# Patient Record
Sex: Male | Born: 1935 | Race: White | Hispanic: No | Smoking: Never smoker
Health system: Southern US, Community
[De-identification: ages and names within clinical notes are randomized; demographics above are authoritative.]

## PROBLEM LIST (undated history)

## (undated) DIAGNOSIS — I10 Essential (primary) hypertension: Secondary | ICD-10-CM

## (undated) DIAGNOSIS — E119 Type 2 diabetes mellitus without complications: Secondary | ICD-10-CM

## (undated) DIAGNOSIS — E079 Disorder of thyroid, unspecified: Secondary | ICD-10-CM

## (undated) DIAGNOSIS — M1990 Unspecified osteoarthritis, unspecified site: Secondary | ICD-10-CM

## (undated) HISTORY — PX: HERNIA REPAIR: SHX51

---

## 2017-05-15 ENCOUNTER — Other Ambulatory Visit: Payer: Self-pay

## 2017-05-15 ENCOUNTER — Emergency Department (HOSPITAL_BASED_OUTPATIENT_CLINIC_OR_DEPARTMENT_OTHER): Payer: Medicare Other

## 2017-05-15 ENCOUNTER — Encounter (HOSPITAL_BASED_OUTPATIENT_CLINIC_OR_DEPARTMENT_OTHER): Payer: Self-pay | Admitting: Emergency Medicine

## 2017-05-15 ENCOUNTER — Emergency Department (HOSPITAL_BASED_OUTPATIENT_CLINIC_OR_DEPARTMENT_OTHER)
Admission: EM | Admit: 2017-05-15 | Discharge: 2017-05-15 | Disposition: A | Discharge status: Home / Self Care | Payer: Medicare Other | Attending: Emergency Medicine | Admitting: Emergency Medicine

## 2017-05-15 DIAGNOSIS — I10 Essential (primary) hypertension: Secondary | ICD-10-CM | POA: Insufficient documentation

## 2017-05-15 DIAGNOSIS — M25561 Pain in right knee: Secondary | ICD-10-CM | POA: Insufficient documentation

## 2017-05-15 DIAGNOSIS — Z79899 Other long term (current) drug therapy: Secondary | ICD-10-CM | POA: Insufficient documentation

## 2017-05-15 DIAGNOSIS — E119 Type 2 diabetes mellitus without complications: Secondary | ICD-10-CM | POA: Diagnosis not present

## 2017-05-15 DIAGNOSIS — G8929 Other chronic pain: Secondary | ICD-10-CM | POA: Diagnosis not present

## 2017-05-15 DIAGNOSIS — M899 Disorder of bone, unspecified: Secondary | ICD-10-CM

## 2017-05-15 DIAGNOSIS — M7989 Other specified soft tissue disorders: Secondary | ICD-10-CM

## 2017-05-15 DIAGNOSIS — R2241 Localized swelling, mass and lump, right lower limb: Secondary | ICD-10-CM | POA: Insufficient documentation

## 2017-05-15 DIAGNOSIS — R600 Localized edema: Secondary | ICD-10-CM | POA: Diagnosis not present

## 2017-05-15 HISTORY — DX: Disorder of thyroid, unspecified: E07.9

## 2017-05-15 HISTORY — DX: Type 2 diabetes mellitus without complications: E11.9

## 2017-05-15 HISTORY — DX: Essential (primary) hypertension: I10

## 2017-05-15 HISTORY — DX: Unspecified osteoarthritis, unspecified site: M19.90

## 2017-05-15 LAB — COMPREHENSIVE METABOLIC PANEL
ALT: 14 U/L — ABNORMAL LOW (ref 17–63)
ANION GAP: 9 (ref 5–15)
AST: 15 U/L (ref 15–41)
Albumin: 3.7 g/dL (ref 3.5–5.0)
Alkaline Phosphatase: 84 U/L (ref 38–126)
BUN: 49 mg/dL — ABNORMAL HIGH (ref 6–20)
CHLORIDE: 106 mmol/L (ref 101–111)
CO2: 25 mmol/L (ref 22–32)
Calcium: 8.6 mg/dL — ABNORMAL LOW (ref 8.9–10.3)
Creatinine, Ser: 1.5 mg/dL — ABNORMAL HIGH (ref 0.61–1.24)
GFR calc Af Amer: 41 mL/min — ABNORMAL LOW (ref >60)
GFR calc non Af Amer: 36 mL/min — ABNORMAL LOW (ref >60)
Glucose, Bld: 141 mg/dL — ABNORMAL HIGH (ref 65–99)
POTASSIUM: 3.8 mmol/L (ref 3.5–5.1)
SODIUM: 140 mmol/L (ref 135–145)
Total Bilirubin: 0.6 mg/dL (ref 0.3–1.2)
Total Protein: 6.4 g/dL — ABNORMAL LOW (ref 6.5–8.1)

## 2017-05-15 LAB — CBC WITH DIFFERENTIAL/PLATELET
BASOS ABS: 0 10*3/uL (ref 0.0–0.1)
Basophils Relative: 0 %
EOS ABS: 0.4 10*3/uL (ref 0.0–0.7)
Eosinophils Relative: 7 %
HCT: 35.3 % — ABNORMAL LOW (ref 39.0–52.0)
Hemoglobin: 12.1 g/dL — ABNORMAL LOW (ref 13.0–17.0)
Lymphocytes Relative: 25 %
Lymphs Abs: 1.5 10*3/uL (ref 0.7–4.0)
MCH: 33.9 pg (ref 26.0–34.0)
MCHC: 34.3 g/dL (ref 30.0–36.0)
MCV: 98.9 fL (ref 78.0–100.0)
MONO ABS: 0.7 10*3/uL (ref 0.1–1.0)
MONOS PCT: 12 %
NEUTROS PCT: 56 %
Neutro Abs: 3.4 10*3/uL (ref 1.7–7.7)
Platelets: 130 10*3/uL — ABNORMAL LOW (ref 150–400)
RBC: 3.57 MIL/uL — ABNORMAL LOW (ref 4.22–5.81)
RDW: 13.2 % (ref 11.5–15.5)
WBC: 6.1 10*3/uL (ref 4.0–10.5)

## 2017-05-15 LAB — BRAIN NATRIURETIC PEPTIDE: B Natriuretic Peptide: 103.5 pg/mL — ABNORMAL HIGH (ref 0.0–100.0)

## 2017-05-15 MED ORDER — ASPIRIN 81 MG PO CHEW
324.0000 mg | CHEWABLE_TABLET | Freq: Once | ORAL | Status: AC
  Administered 2017-05-15: 324 mg via ORAL
  Filled 2017-05-15: qty 4

## 2017-05-15 NOTE — ED Notes (Signed)
Patient transported to X-ray 

## 2017-05-15 NOTE — ED Triage Notes (Signed)
PT presents with c/o right leg swelling for 2 weeks.

## 2017-05-15 NOTE — ED Notes (Signed)
ED Provider at bedside. 

## 2017-05-15 NOTE — ED Provider Notes (Signed)
[facility] Provider Note: Lowella Dell, MD, FACEP  CSN: [id number] MRN: [id number] ARRIVAL: 05/15/17 at 0029 ROOM: [facility]09/[facility]09   CHIEF COMPLAINT  Leg Swelling   HISTORY OF PRESENT ILLNESS  05/15/17 5:11 AM Mitchell Edwards is a 84 y.o. male with a long-standing history of pain in the right knee for which he gets periodic cortisone injections.  The pain in his knee has worsened over the past week or so.  He was previously weightbearing but has recently begun using a wheelchair due to the pain.  He currently rates his pain as an 8 out of 10 at its worst.  He states the pain radiates up to his hip as well as down to his foot.  It is worse with ambulation and movement of the right knee.  He has also had edema of the right lower leg and foot for about the past week.  He is not aware of any recent trauma to the knee.  Past Medical History:  Diagnosis Date  . Arthritis   . Diabetes mellitus without complication (HCC)   . Hypertension   . Thyroid disease     Past Surgical History:  Procedure Laterality Date  . HERNIA REPAIR      No family history on file.  Social History   Tobacco Use  . Smoking status: Never Smoker  . Smokeless tobacco: Never Used  Substance Use Topics  . Alcohol use: No    Frequency: Never  . Drug use: No    Prior to Admission medications   Medication Sig Start Date End Date Taking? Authorizing Provider  furosemide (LASIX) 40 MG tablet Take 40 mg by mouth.   Yes [provider]  levothyroxine (SYNTHROID, LEVOTHROID) 25 MCG tablet Take 25 mcg by mouth daily before breakfast.   Yes [provider]  lisinopril (PRINIVIL,ZESTRIL) 2.5 MG tablet Take 2.5 mg by mouth daily.   Yes [provider]  lubiprostone (AMITIZA) 8 MCG capsule Take 8 mcg by mouth 2 (two) times daily with a meal.   Yes [provider]  omeprazole (PRILOSEC) 40 MG capsule Take 40 mg by mouth daily.   Yes [provider]     Allergies Codeine; Diclofenac; Penicillins; Sulfa antibiotics; Tetanus toxoids; and Tylenol [acetaminophen]   REVIEW OF SYSTEMS  Negative except as noted here or in the History of Present Illness.   PHYSICAL EXAMINATION  Initial Vital Signs Blood pressure (!) 145/100, pulse 89, temperature 97.6 F (36.4 C), temperature source Oral, resp. rate 19, weight 63.5 kg (140 lb), SpO2 98 %.  Examination General: Well-developed, well-nourished male in no acute distress; appearance consistent with age of record HENT: normocephalic; atraumatic Eyes: pupils equal, round and reactive to light; extraocular muscles intact Neck: supple Heart: regular rate and rhythm Lungs: clear to auscultation bilaterally Abdomen: soft; nondistended; nontender; bowel sounds present Extremities: Arthritic changes; pain on flexion and extension of right knee; edema of right lower leg and foot; pulses normal Neurologic: Awake, alert; motor function intact in all extremities and symmetric; no facial droop; hearing Skin: Warm and dry; multiple seborrheic keratoses Psychiatric: Normal mood and affect   RESULTS  Summary of this visit's results, reviewed by myself:   EKG Interpretation  Date/Time:    Ventricular Rate:    PR Interval:    QRS Duration:   QT Interval:    QTC Calculation:   R Axis:     Text Interpretation:        Laboratory Studies: Results for orders  placed or performed during the hospital encounter of 05/15/17 (from the past 24 hour(s))  CBC with Differential     Status: Abnormal   Collection Time: 05/15/17  2:16 AM  Result Value Ref Range   WBC 6.1 4.0 - 10.5 K/uL   RBC 3.57 (L) 4.22 - 5.81 MIL/uL   Hemoglobin 12.1 (L) 13.0 - 17.0 g/dL   HCT 16.1 (L) 09.6 - 04.5 %   MCV 98.9 78.0 - 100.0 fL   MCH 33.9 26.0 - 34.0 pg   MCHC 34.3 30.0 - 36.0 g/dL   RDW 40.9 81.1 - 91.4 %   Platelets 130 (L) 150 - 400 K/uL   Neutrophils Relative % 56 %   Neutro Abs 3.4 1.7 - 7.7 K/uL    Lymphocytes Relative 25 %   Lymphs Abs 1.5 0.7 - 4.0 K/uL   Monocytes Relative 12 %   Monocytes Absolute 0.7 0.1 - 1.0 K/uL   Eosinophils Relative 7 %   Eosinophils Absolute 0.4 0.0 - 0.7 K/uL   Basophils Relative 0 %   Basophils Absolute 0.0 0.0 - 0.1 K/uL  Comprehensive metabolic panel     Status: Abnormal   Collection Time: 05/15/17  2:16 AM  Result Value Ref Range   Sodium 140 135 - 145 mmol/L   Potassium 3.8 3.5 - 5.1 mmol/L   Chloride 106 101 - 111 mmol/L   CO2 25 22 - 32 mmol/L   Glucose, Bld 141 (H) 65 - 99 mg/dL   BUN 49 (H) 6 - 20 mg/dL   Creatinine, Ser 7.82 (H) 0.61 - 1.24 mg/dL   Calcium 8.6 (L) 8.9 - 10.3 mg/dL   Total Protein 6.4 (L) 6.5 - 8.1 g/dL   Albumin 3.7 3.5 - 5.0 g/dL   AST 15 15 - 41 U/L   ALT 14 (L) 17 - 63 U/L   Alkaline Phosphatase 84 38 - 126 U/L   Total Bilirubin 0.6 0.3 - 1.2 mg/dL   GFR calc non Af Amer 36 (L) >60 mL/min   GFR calc Af Amer 41 (L) >60 mL/min   Anion gap 9 5 - 15  Brain natriuretic peptide     Status: Abnormal   Collection Time: 05/15/17  2:16 AM  Result Value Ref Range   B Natriuretic Peptide 103.5 (H) 0.0 - 100.0 pg/mL   Imaging Studies: Dg Knee Complete 4 Views Right  Result Date: 05/15/2017 CLINICAL DATA:  84 y/o M; 10 years of right knee pain with recent swelling. EXAM: RIGHT KNEE - COMPLETE 4+ VIEW COMPARISON:  None. FINDINGS: Patella baja. No acute fracture or dislocation. No joint effusion. Mild medial femorotibial compartment joint space narrowing. Vascular calcifications. 3.6 cm chondroid lesion within the distal femoral metaphysis, well-circumscribed margins, no endosteal scalloping or bony destructive changes. IMPRESSION: 1. No acute fracture or dislocation. 2. Patella baja. 3. Mild medial femorotibial compartment joint space narrowing. 4. 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 Recommend radiographic follow-up to assure stability or  consider further evaluation with bone scan. Electronically Signed   By: Mitzi Hansen M.D.   On: 05/15/2017 05:50    ED COURSE  Nursing notes and initial vitals signs, including pulse oximetry, reviewed.  Vitals:   05/15/17 0040 05/15/17 0445  BP: (!) 156/85 (!) 145/100  Pulse: 94 89  Resp: 16 19  Temp: 97.6 F (36.4 C)   TempSrc: Oral   SpO2: 98% 98%  Weight: 63.5 kg (140 lb)  6:25 AM X-ray findings explained to patient and son.  They were offered the opportunity for a Doppler ultrasound but would prefer not to wait.  They plan to follow-up with his primary care physician, Dr. Nolen MuMcKinney, and [place] later today.   PROCEDURES    ED DIAGNOSES     ICD-10-CM   1. Right leg swelling M79.89   2. Chronic pain of right knee M25.561    G89.29   3. Acute pain of right knee M25.561   4. Lesion of pelvic bone M89.9        Kalyan Barabas, Jonny RuizJohn, MD 05/15/17 [phone]

## 2017-11-11 ENCOUNTER — Encounter (HOSPITAL_BASED_OUTPATIENT_CLINIC_OR_DEPARTMENT_OTHER): Payer: Self-pay | Admitting: Emergency Medicine

## 2017-11-11 ENCOUNTER — Emergency Department (HOSPITAL_BASED_OUTPATIENT_CLINIC_OR_DEPARTMENT_OTHER)
Admission: EM | Admit: 2017-11-11 | Discharge: 2017-11-11 | Disposition: A | Discharge status: Skilled Nursing Facility | Payer: Medicare Other | Attending: Emergency Medicine | Admitting: Emergency Medicine

## 2017-11-11 ENCOUNTER — Other Ambulatory Visit: Payer: Self-pay

## 2017-11-11 DIAGNOSIS — I10 Essential (primary) hypertension: Secondary | ICD-10-CM | POA: Diagnosis not present

## 2017-11-11 DIAGNOSIS — K219 Gastro-esophageal reflux disease without esophagitis: Secondary | ICD-10-CM | POA: Insufficient documentation

## 2017-11-11 DIAGNOSIS — Z79899 Other long term (current) drug therapy: Secondary | ICD-10-CM | POA: Insufficient documentation

## 2017-11-11 DIAGNOSIS — R1012 Left upper quadrant pain: Secondary | ICD-10-CM

## 2017-11-11 DIAGNOSIS — E119 Type 2 diabetes mellitus without complications: Secondary | ICD-10-CM | POA: Insufficient documentation

## 2017-11-11 LAB — LIPASE, BLOOD: Lipase: 24 U/L (ref 11–51)

## 2017-11-11 LAB — COMPREHENSIVE METABOLIC PANEL
ALT: 11 U/L (ref 0–44)
ANION GAP: 6 (ref 5–15)
AST: 14 U/L — ABNORMAL LOW (ref 15–41)
Albumin: 3.3 g/dL — ABNORMAL LOW (ref 3.5–5.0)
Alkaline Phosphatase: 62 U/L (ref 38–126)
BILIRUBIN TOTAL: 0.4 mg/dL (ref 0.3–1.2)
BUN: 28 mg/dL — AB (ref 8–23)
CO2: 26 mmol/L (ref 22–32)
Calcium: 8.3 mg/dL — ABNORMAL LOW (ref 8.9–10.3)
Chloride: 105 mmol/L (ref 98–111)
Creatinine, Ser: 1.44 mg/dL — ABNORMAL HIGH (ref 0.61–1.24)
GFR calc Af Amer: 43 mL/min — ABNORMAL LOW (ref >60)
GFR calc non Af Amer: 37 mL/min — ABNORMAL LOW (ref >60)
Glucose, Bld: 176 mg/dL — ABNORMAL HIGH (ref 70–99)
POTASSIUM: 3.8 mmol/L (ref 3.5–5.1)
Sodium: 137 mmol/L (ref 135–145)
TOTAL PROTEIN: 6 g/dL — AB (ref 6.5–8.1)

## 2017-11-11 LAB — CBC
HCT: 34.9 % — ABNORMAL LOW (ref 39.0–52.0)
Hemoglobin: 11.8 g/dL — ABNORMAL LOW (ref 13.0–17.0)
MCH: 33.1 pg (ref 26.0–34.0)
MCHC: 33.8 g/dL (ref 30.0–36.0)
MCV: 98 fL (ref 78.0–100.0)
PLATELETS: 108 10*3/uL — AB (ref 150–400)
RBC: 3.56 MIL/uL — AB (ref 4.22–5.81)
RDW: 14 % (ref 11.5–15.5)
WBC: 5.8 10*3/uL (ref 4.0–10.5)

## 2017-11-11 LAB — URINALYSIS, ROUTINE W REFLEX MICROSCOPIC
BILIRUBIN URINE: NEGATIVE
GLUCOSE, UA: NEGATIVE mg/dL
Ketones, ur: NEGATIVE mg/dL
Leukocytes, UA: NEGATIVE
Nitrite: NEGATIVE
PROTEIN: 100 mg/dL — AB
SPECIFIC GRAVITY, URINE: 1.025 (ref 1.005–1.030)
pH: 6 (ref 5.0–8.0)

## 2017-11-11 LAB — URINALYSIS, MICROSCOPIC (REFLEX)
SQUAMOUS EPITHELIAL / LPF: NONE SEEN (ref 0–5)
WBC, UA: NONE SEEN WBC/hpf (ref 0–5)

## 2017-11-11 MED ORDER — PANTOPRAZOLE SODIUM 20 MG PO TBEC
20.0000 mg | DELAYED_RELEASE_TABLET | Freq: Once | ORAL | Status: DC
  Filled 2017-11-11: qty 1

## 2017-11-11 MED ORDER — LISINOPRIL 10 MG PO TABS
10.0000 mg | ORAL_TABLET | Freq: Once | ORAL | Status: AC
  Administered 2017-11-11: 10 mg via ORAL
  Filled 2017-11-11: qty 1

## 2017-11-11 MED ORDER — PANTOPRAZOLE SODIUM 20 MG PO TBEC: 20.0000 mg | DELAYED_RELEASE_TABLET | Freq: Every day | ORAL | 0 refills | Status: AC

## 2017-11-11 NOTE — ED Notes (Signed)
Blood pressure rechecked manually= 192/102 to right arm; Recheck using automatic monitor, 197/108.  Will notify EDP.

## 2017-11-11 NOTE — ED Provider Notes (Signed)
[facility] EMERGENCY DEPARTMENT Provider Note   CSN: [id number] Arrival date & time: 11/11/17  1427     History   Chief Complaint Chief Complaint  Patient presents with  . Abdominal Pain    HPI Mitchell Edwards is a 84 y.o. male.  HPI Patient has had problems with left upper quadrant abdominal pain for about 6 months.  He has seen his doctor several times for this.  Is been no specific diagnosis but has been suspected to be reflux.  He is taking Zantac.  She reports that he does get problems with feeling like he is vomiting small amounts.  This comes and goes.  It does not occur with every meal.  It does seem sometimes worse with lying down.  Patient ate a normal breakfast of eggs and sausage this morning.  He has not had any vomiting or spitting up.  Takes Zantac daily per report.  Patient son reports that just today he moved to the bed into the 30 degree elevation position.  Had been sleeping flat.  Patient reports he has had no weight loss.  He reports he is probably actually gained 10 pounds in about the past 6 months.  No fevers, no cough, no chest pain.  No urinary symptoms. Past Medical History:  Diagnosis Date  . Arthritis   . Diabetes mellitus without complication (HCC)   . Hypertension   . Thyroid disease     There are no active problems to display for this patient.   Past Surgical History:  Procedure Laterality Date  . HERNIA REPAIR          Home Medications    Prior to Admission medications   Medication Sig Start Date End Date Taking? Authorizing Provider  gabapentin (NEURONTIN) 100 MG capsule Take 100 mg by mouth 3 (three) times daily.   Yes [provider]  ranitidine (ZANTAC) 150 MG capsule Take 150 mg by mouth 2 (two) times daily.   Yes [provider]  furosemide (LASIX) 40 MG tablet Take 40 mg by mouth.    [provider]  levothyroxine (SYNTHROID, LEVOTHROID) 25 MCG tablet Take 25 mcg by mouth daily before  breakfast.    [provider]  lisinopril (PRINIVIL,ZESTRIL) 2.5 MG tablet Take 2.5 mg by mouth daily.    [provider]  lubiprostone (AMITIZA) 8 MCG capsule Take 8 mcg by mouth 2 (two) times daily with a meal.    [provider]  omeprazole (PRILOSEC) 40 MG capsule Take 40 mg by mouth daily.    [provider]  pantoprazole (PROTONIX) 20 MG tablet Take 1 tablet (20 mg total) by mouth daily. 11/11/17   Arby Barrette, MD    Family History History reviewed. No pertinent family history.  Social History Social History   Tobacco Use  . Smoking status: Never Smoker  . Smokeless tobacco: Never Used  Substance Use Topics  . Alcohol use: No    Frequency: Never  . Drug use: No     Allergies   Codeine; Diclofenac; Penicillins; Sulfa antibiotics; Tetanus toxoids; and Tylenol [acetaminophen]   Review of Systems Review of Systems 10 Systems reviewed and are negative for acute change except as noted in the HPI.   Physical Exam Updated Vital Signs BP (!) 195/92 (BP Location: Right Arm)   Pulse 78   Temp 98 F (36.7 C) (Oral)   Resp 20   Ht 5\' 6"  (1.676 m)   Wt 66.2 kg (146 lb)  SpO2 97%   BMI 23.57 kg/m   Physical Exam  Constitutional: He is oriented to person, place, and time. He appears well-developed and well-nourished.  Patient is extremely well at 84 years old.  As I was approaching the room he is ambulating into the room from the bathroom.  He appears steady.  HENT:  Head: Normocephalic and atraumatic.  Mouth/Throat: Oropharynx is clear and moist.  Eyes: EOM are normal.  Neck: Neck supple.  Cardiovascular: Normal rate, regular rhythm, normal heart sounds and intact distal pulses.  Pulmonary/Chest: Effort normal and breath sounds normal.  Abdominal: Soft. Bowel sounds are normal. He exhibits no distension. There is no tenderness. There is no guarding.  Patient has no reproducible abdominal pain.  I have done firm and deep palpation  with no sign of pain or guarding.  Musculoskeletal: Normal range of motion. He exhibits no edema or tenderness.  No  peripheral edema.  Musculature is well-developed of the lower extremities.  Neurological: He is alert and oriented to person, place, and time. He has normal strength. He exhibits normal muscle tone. Coordination normal. GCS eye subscore is 4. GCS verbal subscore is 5. GCS motor subscore is 6.  Skin: Skin is warm, dry and intact.  Psychiatric: He has a normal mood and affect.     ED Treatments / Results  Labs (all labs ordered are listed, but only abnormal results are displayed) Labs Reviewed  COMPREHENSIVE METABOLIC PANEL - Abnormal; Notable for the following components:      Result Value   Glucose, Bld 176 (*)    BUN 28 (*)    Creatinine, Ser 1.44 (*)    Calcium 8.3 (*)    Total Protein 6.0 (*)    Albumin 3.3 (*)    AST 14 (*)    GFR calc non Af Amer 37 (*)    GFR calc Af Amer 43 (*)    All other components within normal limits  CBC - Abnormal; Notable for the following components:   RBC 3.56 (*)    Hemoglobin 11.8 (*)    HCT 34.9 (*)    Platelets 108 (*)    All other components within normal limits  URINALYSIS, ROUTINE W REFLEX MICROSCOPIC - Abnormal; Notable for the following components:   Hgb urine dipstick TRACE (*)    Protein, ur 100 (*)    All other components within normal limits  URINALYSIS, MICROSCOPIC (REFLEX) - Abnormal; Notable for the following components:   Bacteria, UA MANY (*)    All other components within normal limits  URINE CULTURE  LIPASE, BLOOD    EKG None  Radiology No results found.  Procedures Procedures (including critical care time)  Medications Ordered in ED Medications - No data to display   Initial Impression / Assessment and Plan / ED Course  I have reviewed the triage vital signs and the nursing notes.  Pertinent labs & imaging results that were available during my care of the patient were reviewed by me and  considered in my medical decision making (see chart for details).     Final Clinical Impressions(s) / ED Diagnoses   Final diagnoses:  Left upper quadrant pain  Gastroesophageal reflux disease, esophagitis presence not specified  Patient is clinically well.  Diagnostic studies do not suggest dehydration or acute anemia or leukocytosis.  Symptoms have been present for 6 months with no weight loss.  Symptoms described sound highly suspicious for reflux.  I discussed the risk benefit of proceeding with more advanced  diagnostic studies such as CT with the patient's son and patient.  At this time, decision has been made for increasing empiric treatment for reflux and following up with the patient's PCP for evaluation of response to treatment.  Patient will continue his Zantac and start Protonix daily.  His bed has just been adjusted to 30 degrees elevation.  Added instructions for reflux provided.  Return precautions reviewed.  ED Discharge Orders        Ordered    pantoprazole (PROTONIX) 20 MG tablet  Daily     11/11/17 1727       Arby Barrette, MD 11/11/17 [id number]

## 2017-11-11 NOTE — ED Notes (Signed)
BP taken while patient is sitting on his wheelchair.

## 2017-11-11 NOTE — ED Notes (Signed)
Patient and son is concerned about patient's blood pressure.  MD notified.

## 2017-11-11 NOTE — ED Triage Notes (Signed)
Patient states that he had had abdominal pain and "spitting up" for the last 6 months  - the patient has been to multiple Drs for this and they want to see if there is anything else that they can do for the reflu

## 2017-11-11 NOTE — ED Notes (Signed)
ED Provider at bedside. 

## 2017-11-13 LAB — URINE CULTURE

## 2019-01-08 IMAGING — DX DG KNEE COMPLETE 4+V*R*
4 series · 4 of 4 positions shown · non-contrast
Comparison: None.

CLINICAL DATA: [AGE]/o M; 10 years of right knee pain with recent
swelling.

EXAM:
RIGHT KNEE - COMPLETE 4+ VIEW

[knee ap]
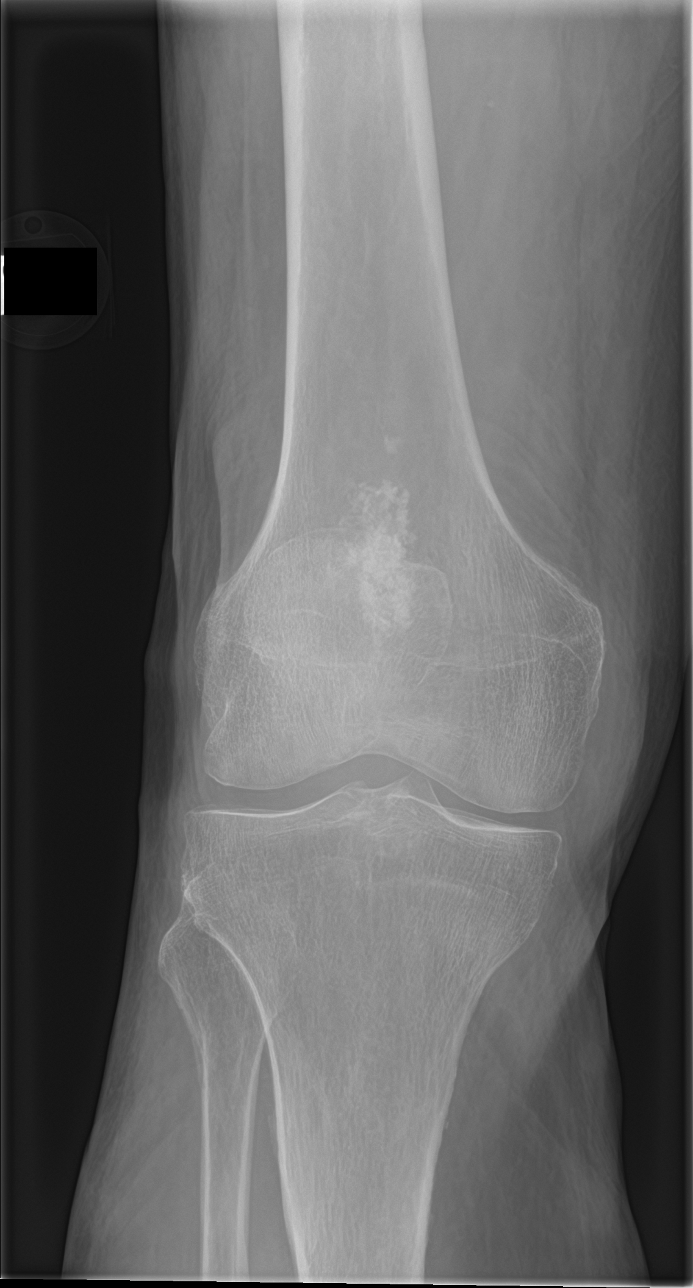

[knee lat]
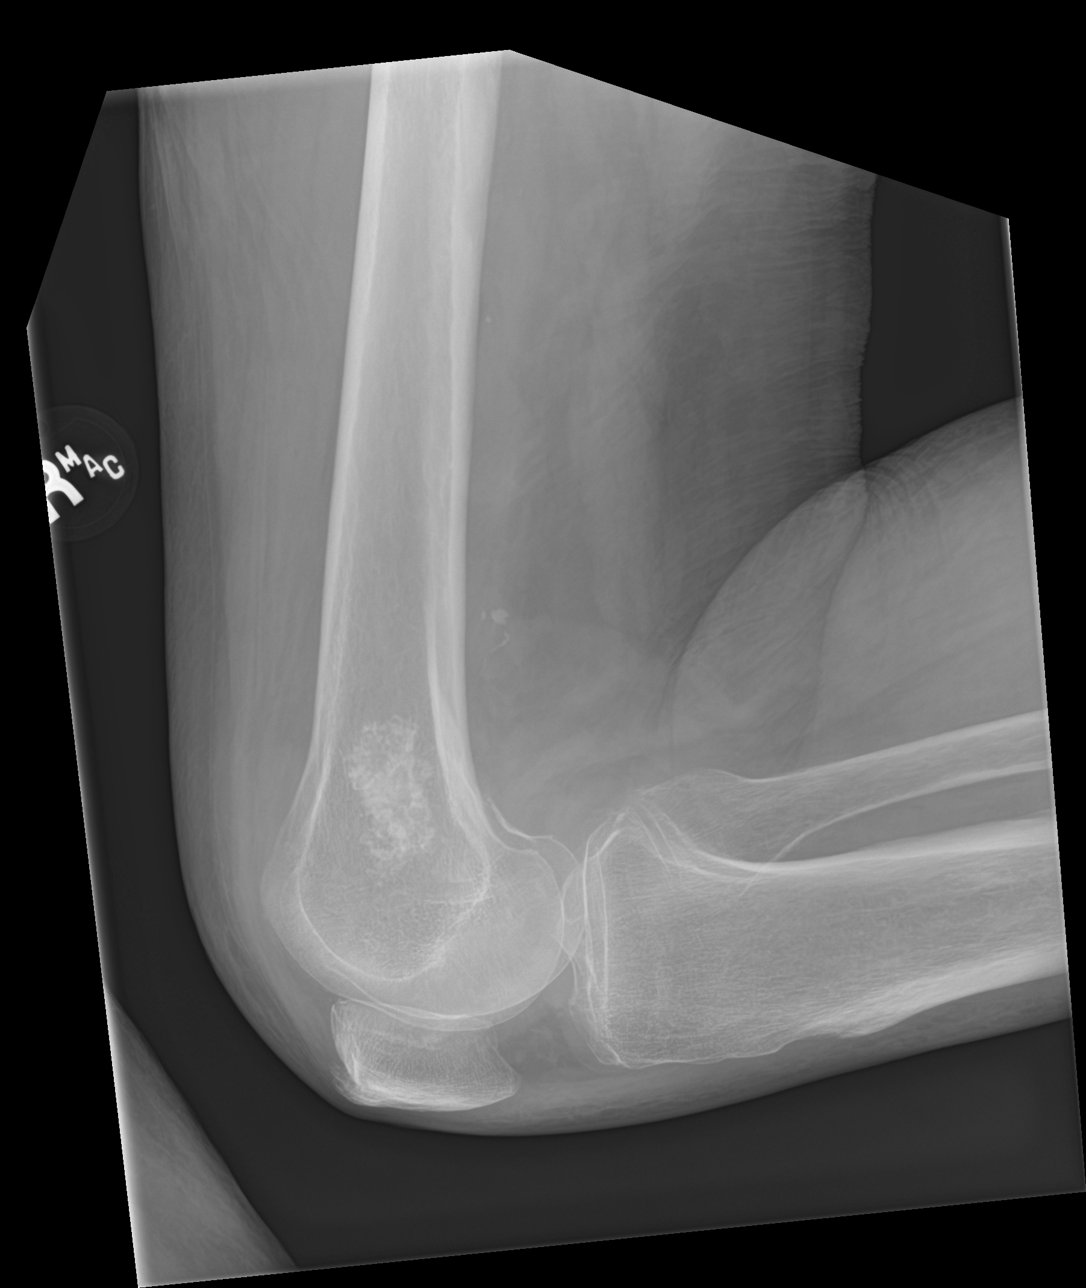

[knee obl (1 of 2)]
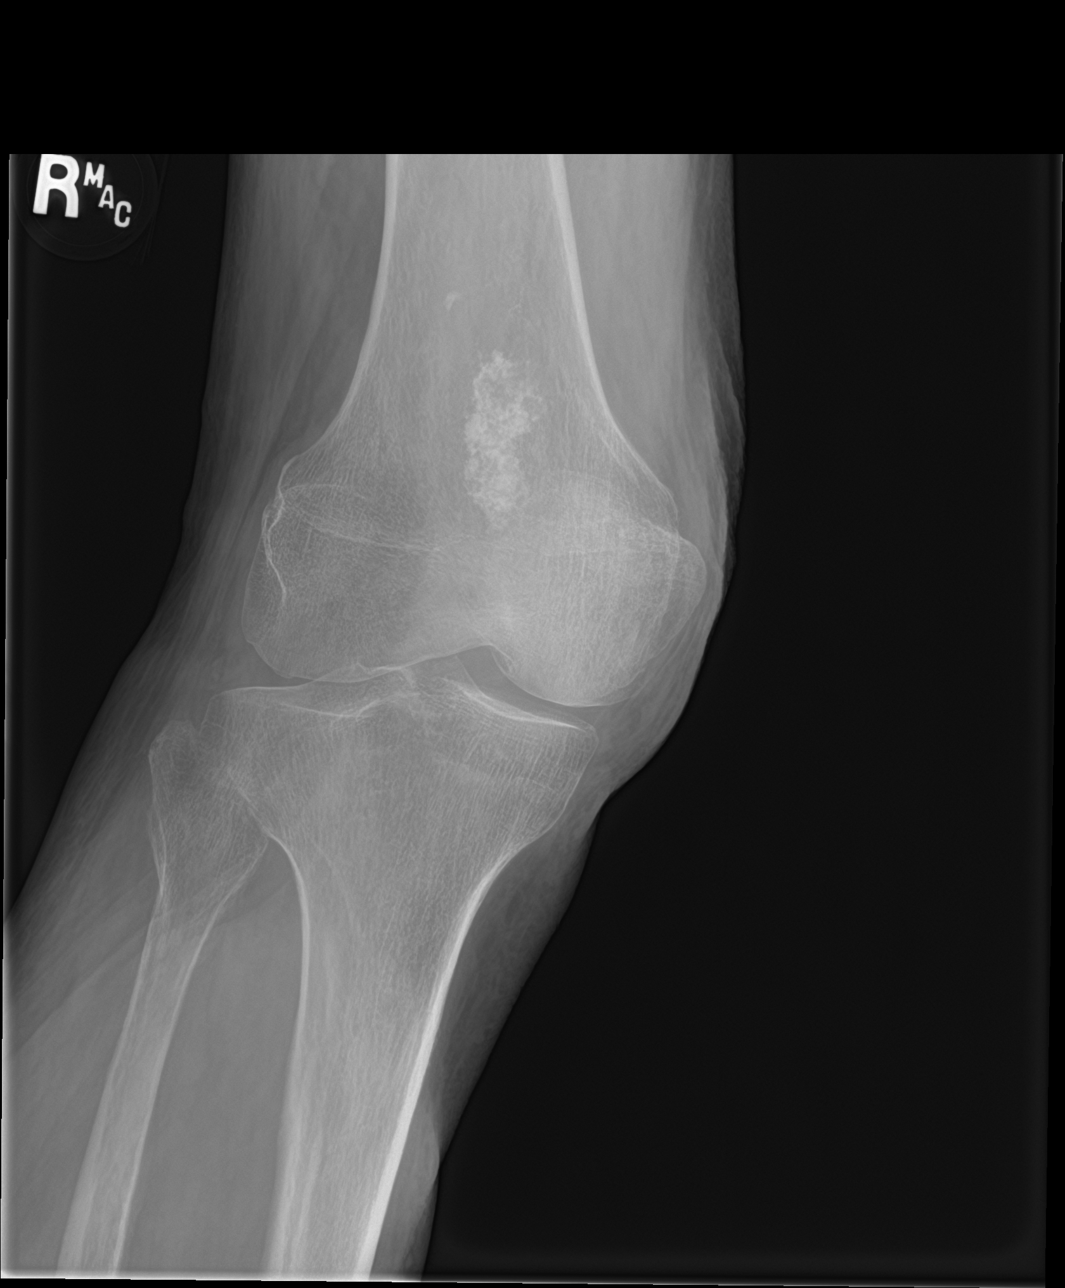

[knee obl (2 of 2)]
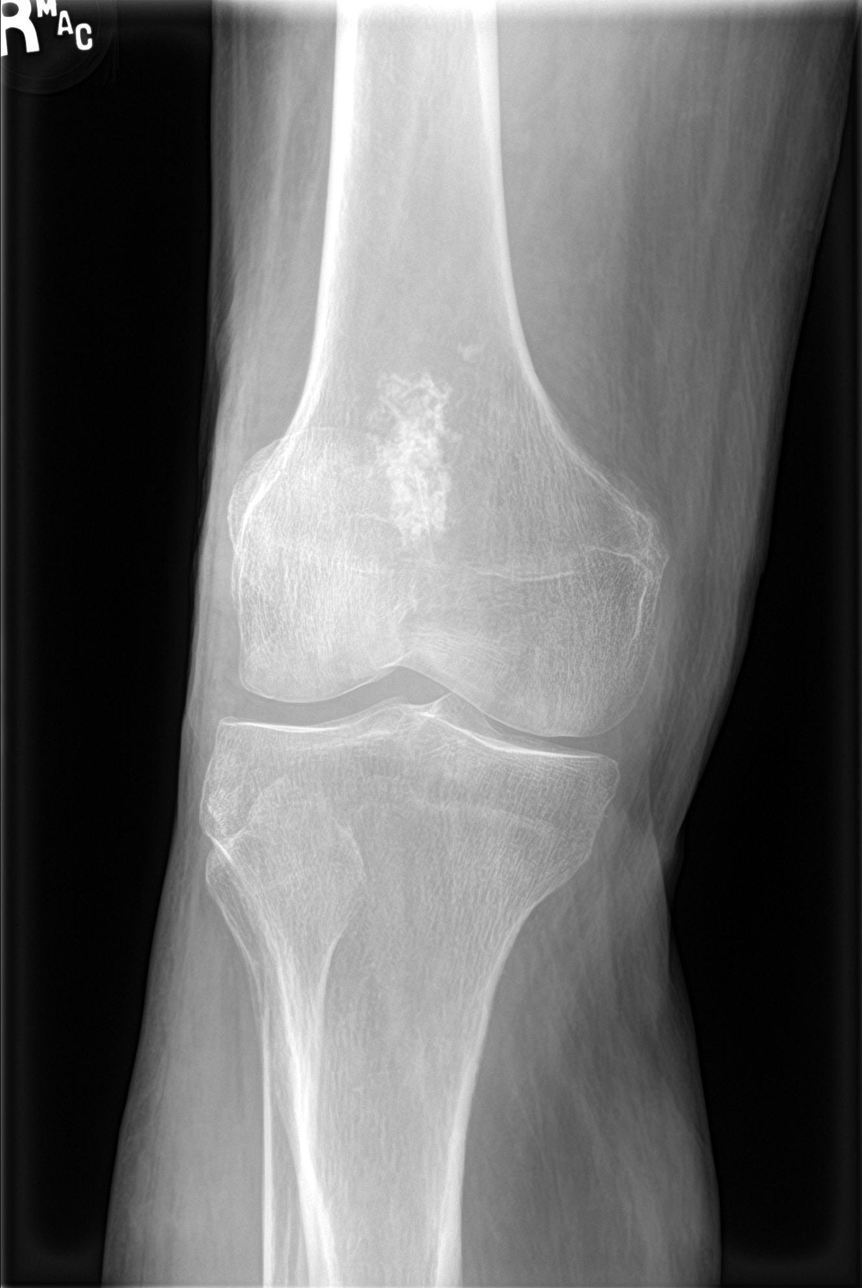

[4 of 4 positions shown; findings below may reference images not displayed]

FINDINGS: Patella baja. No acute fracture or dislocation. No joint effusion.
Mild medial femorotibial compartment joint space narrowing. Vascular
calcifications. 3.6 cm chondroid lesion within the distal femoral
metaphysis, well-circumscribed margins, no endosteal scalloping or
bony destructive changes.
IMPRESSION: 1. No acute fracture or dislocation.
2. Patella baja.
3. Mild medial femorotibial compartment joint space narrowing.
4. 3.6 cm chondroid lesion within right distal femoral metaphysis
without aggressive features, low-grade chondrosarcoma is possible.
Recommend radiographic follow-up to assure stability or consider
further evaluation with bone scan.

By: Vikas Terpstra M.D.

## 2019-02-06 DEATH — deceased
